# Patient Record
Sex: Female | Born: 2005
Health system: Southern US, Community
[De-identification: ages and names within clinical notes are randomized; demographics above are authoritative.]

## PROBLEM LIST (undated history)

## (undated) ENCOUNTER — Emergency Department (HOSPITAL_COMMUNITY): Admission: EM | Payer: BLUE CROSS/BLUE SHIELD | Source: Home / Self Care

## (undated) DIAGNOSIS — F419 Anxiety disorder, unspecified: Secondary | ICD-10-CM

## (undated) DIAGNOSIS — K589 Irritable bowel syndrome without diarrhea: Secondary | ICD-10-CM

---
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
## 2023-01-22 DIAGNOSIS — J019 Acute sinusitis, unspecified: Secondary | ICD-10-CM | POA: Diagnosis not present

## 2023-02-20 DIAGNOSIS — F419 Anxiety disorder, unspecified: Secondary | ICD-10-CM | POA: Diagnosis not present

## 2023-02-20 DIAGNOSIS — R5383 Other fatigue: Secondary | ICD-10-CM | POA: Diagnosis not present

## 2023-04-08 DIAGNOSIS — R52 Pain, unspecified: Secondary | ICD-10-CM | POA: Diagnosis not present

## 2023-04-08 DIAGNOSIS — J0100 Acute maxillary sinusitis, unspecified: Secondary | ICD-10-CM | POA: Diagnosis not present

## 2023-05-10 DIAGNOSIS — F419 Anxiety disorder, unspecified: Secondary | ICD-10-CM | POA: Diagnosis not present

## 2023-07-12 DIAGNOSIS — K219 Gastro-esophageal reflux disease without esophagitis: Secondary | ICD-10-CM | POA: Diagnosis not present

## 2023-07-12 DIAGNOSIS — D509 Iron deficiency anemia, unspecified: Secondary | ICD-10-CM | POA: Diagnosis not present

## 2023-07-12 DIAGNOSIS — R1084 Generalized abdominal pain: Secondary | ICD-10-CM | POA: Diagnosis not present

## 2023-07-12 DIAGNOSIS — G8929 Other chronic pain: Secondary | ICD-10-CM | POA: Diagnosis not present

## 2023-07-21 IMAGING — DX DG CHEST 2V
2 series · 2 of 2 positions shown · non-contrast
Comparison: None.

CLINICAL DATA: Chest pains.

EXAM:
CHEST - 2 VIEW

[chest pa]
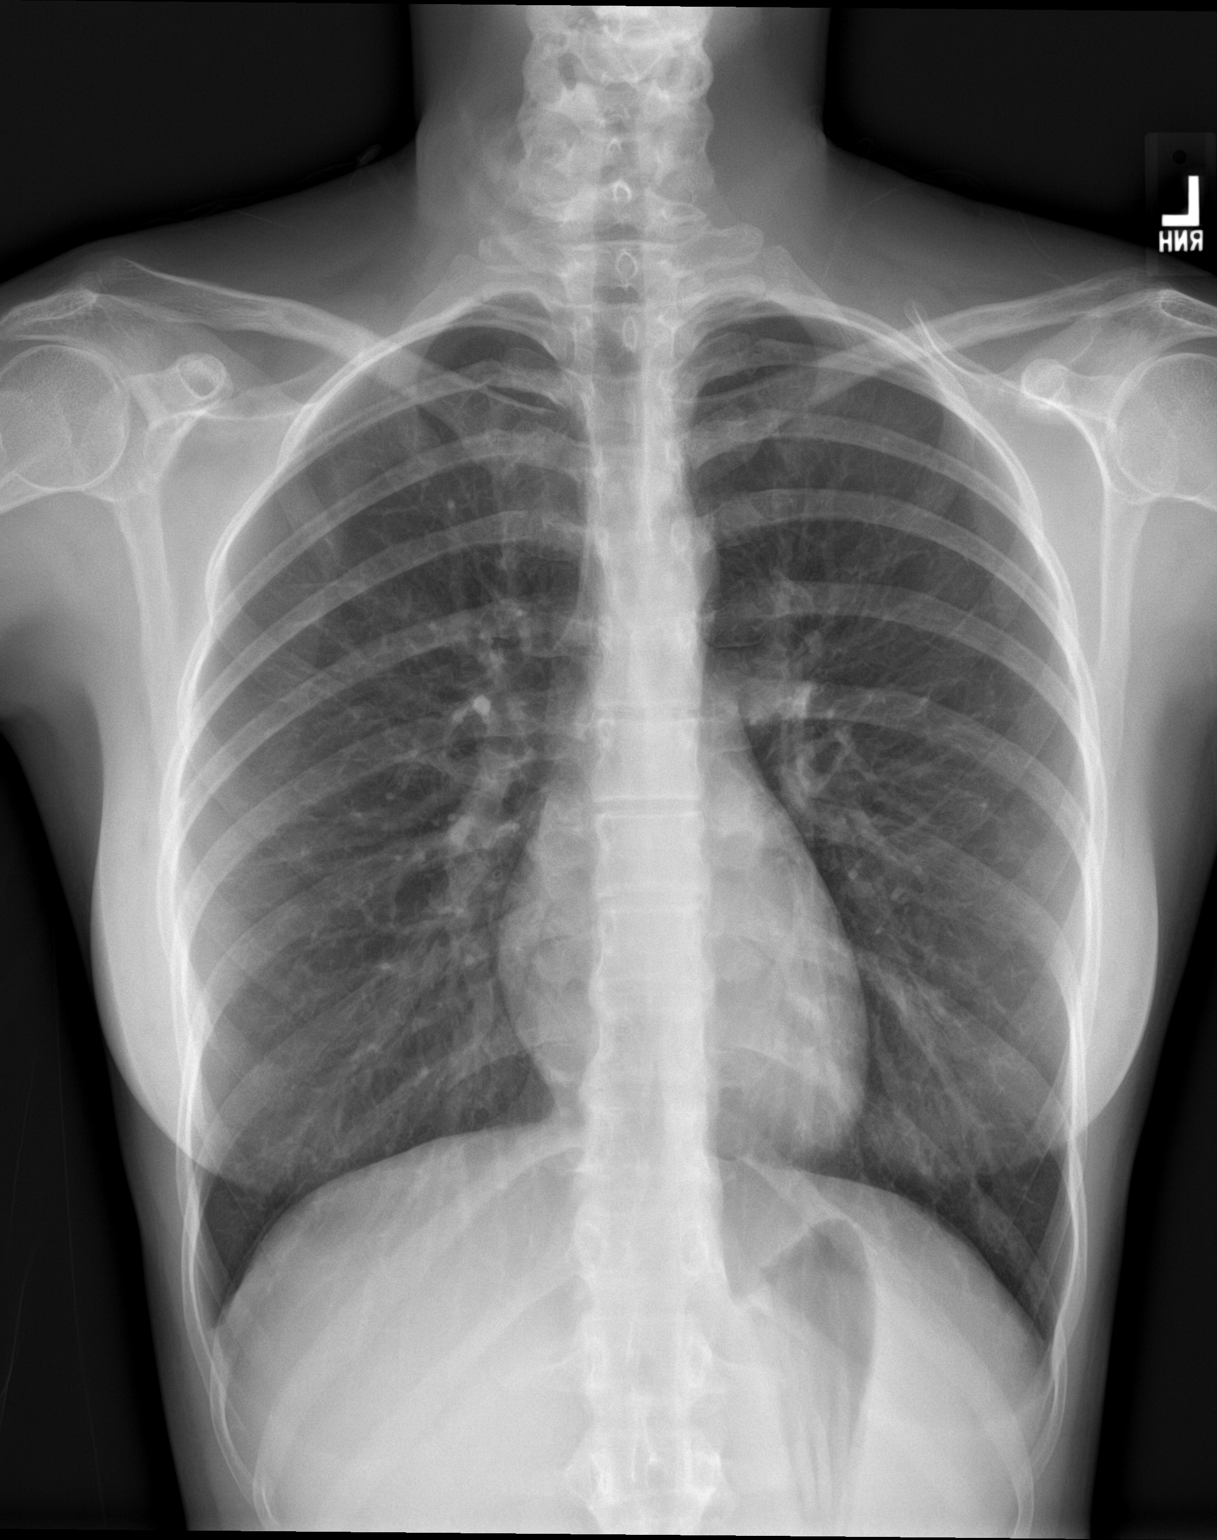

[chest lat]
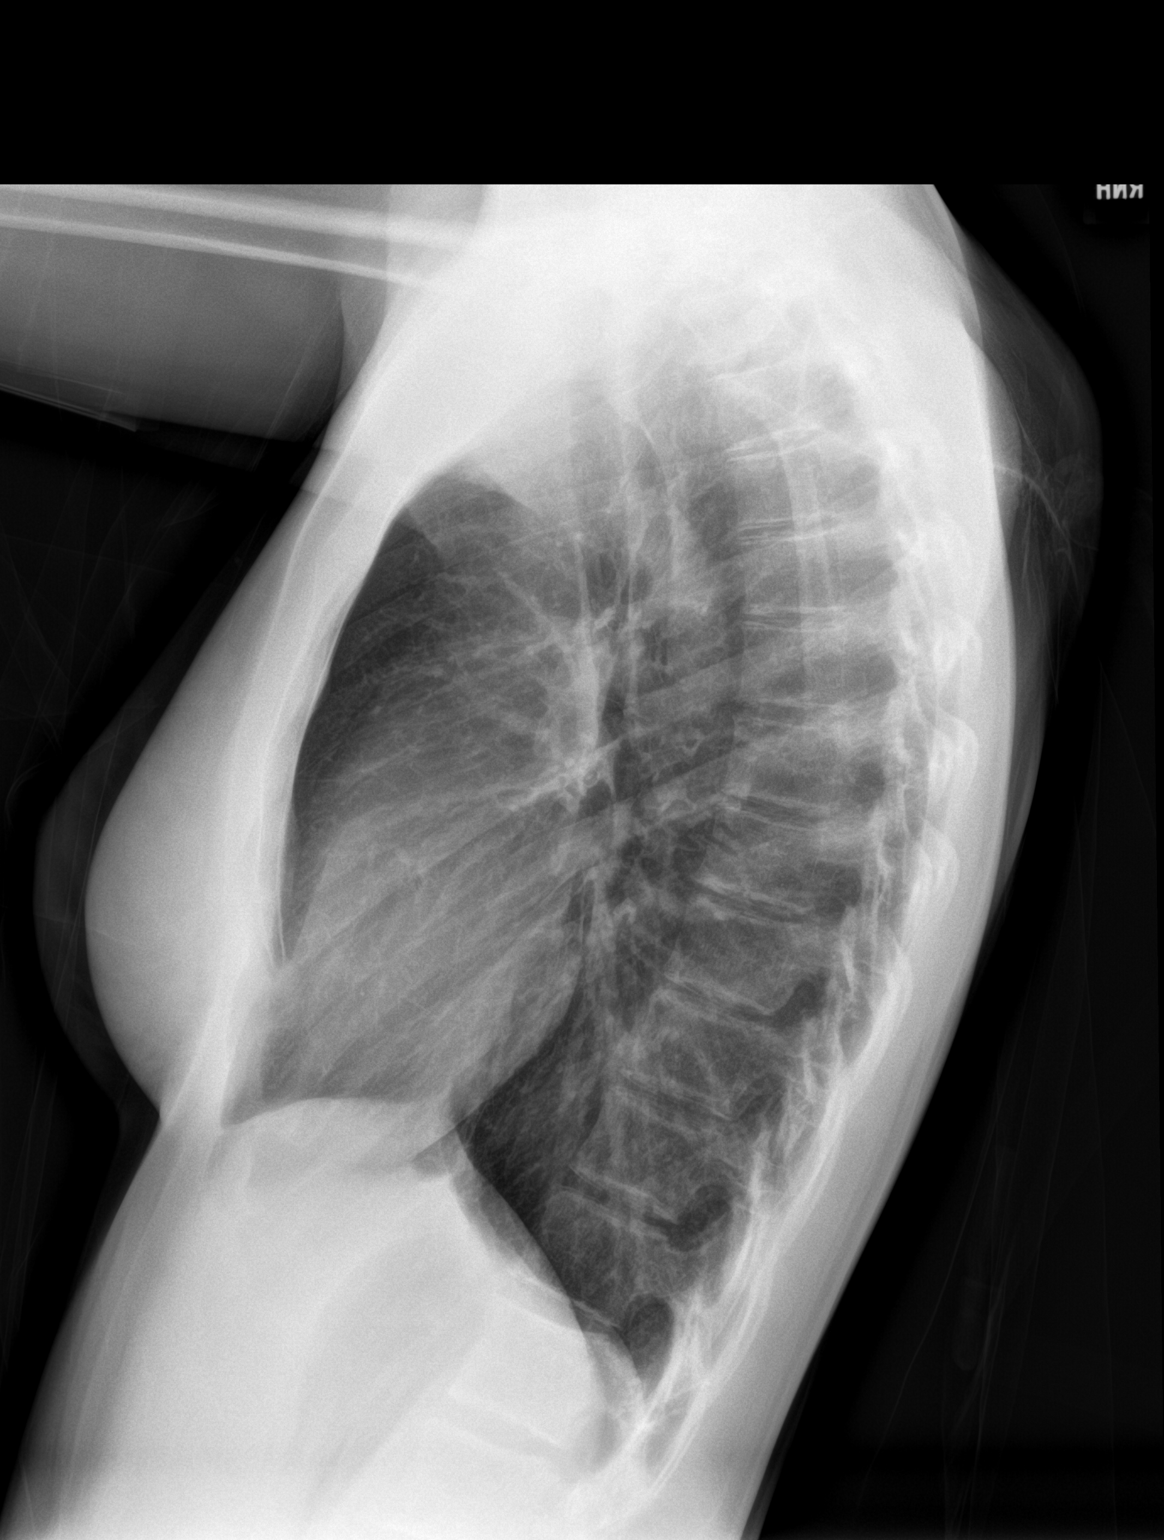

[2 of 2 positions shown; findings below may reference images not displayed]

FINDINGS: The heart size and mediastinal contours are within normal limits.
Both lungs are clear. The visualized skeletal structures are
unremarkable.
IMPRESSION: No active cardiopulmonary disease.

## 2023-08-15 DIAGNOSIS — R1010 Upper abdominal pain, unspecified: Secondary | ICD-10-CM | POA: Diagnosis not present

## 2023-09-04 DIAGNOSIS — K582 Mixed irritable bowel syndrome: Secondary | ICD-10-CM | POA: Diagnosis not present

## 2023-10-16 DIAGNOSIS — K638219 Small intestinal bacterial overgrowth, unspecified: Secondary | ICD-10-CM | POA: Diagnosis not present

## 2023-10-16 DIAGNOSIS — K582 Mixed irritable bowel syndrome: Secondary | ICD-10-CM | POA: Diagnosis not present

## 2023-10-16 DIAGNOSIS — R110 Nausea: Secondary | ICD-10-CM | POA: Diagnosis not present

## 2023-11-19 ENCOUNTER — Other Ambulatory Visit: Payer: Self-pay

## 2023-11-19 ENCOUNTER — Encounter (HOSPITAL_BASED_OUTPATIENT_CLINIC_OR_DEPARTMENT_OTHER): Payer: Self-pay | Admitting: Emergency Medicine

## 2023-11-19 ENCOUNTER — Emergency Department (HOSPITAL_BASED_OUTPATIENT_CLINIC_OR_DEPARTMENT_OTHER)

## 2023-11-19 ENCOUNTER — Emergency Department (HOSPITAL_BASED_OUTPATIENT_CLINIC_OR_DEPARTMENT_OTHER)
Admission: EM | Admit: 2023-11-19 | Discharge: 2023-11-19 | Disposition: A | Discharge status: Home / Self Care | Attending: Emergency Medicine | Admitting: Emergency Medicine

## 2023-11-19 DIAGNOSIS — R9431 Abnormal electrocardiogram [ECG] [EKG]: Secondary | ICD-10-CM | POA: Diagnosis not present

## 2023-11-19 DIAGNOSIS — R1084 Generalized abdominal pain: Secondary | ICD-10-CM | POA: Diagnosis not present

## 2023-11-19 DIAGNOSIS — R197 Diarrhea, unspecified: Secondary | ICD-10-CM | POA: Insufficient documentation

## 2023-11-19 DIAGNOSIS — R110 Nausea: Secondary | ICD-10-CM | POA: Diagnosis not present

## 2023-11-19 DIAGNOSIS — R109 Unspecified abdominal pain: Secondary | ICD-10-CM | POA: Diagnosis not present

## 2023-11-19 DIAGNOSIS — R188 Other ascites: Secondary | ICD-10-CM | POA: Diagnosis not present

## 2023-11-19 LAB — CBC WITH DIFFERENTIAL/PLATELET
Abs Immature Granulocytes: 0.03 K/uL (ref 0.00–0.07)
Basophils Absolute: 0 K/uL (ref 0.0–0.1)
Basophils Relative: 0 %
Eosinophils Absolute: 0.1 K/uL (ref 0.0–0.5)
Eosinophils Relative: 1 %
HCT: 38.2 % (ref 36.0–46.0)
Hemoglobin: 13 g/dL (ref 12.0–15.0)
Immature Granulocytes: 0 %
Lymphocytes Relative: 42 %
Lymphs Abs: 3.5 K/uL (ref 0.7–4.0)
MCH: 28.9 pg (ref 26.0–34.0)
MCHC: 34 g/dL (ref 30.0–36.0)
MCV: 84.9 fL (ref 80.0–100.0)
Monocytes Absolute: 0.6 K/uL (ref 0.1–1.0)
Monocytes Relative: 7 %
Neutro Abs: 4.2 K/uL (ref 1.7–7.7)
Neutrophils Relative %: 50 %
Platelets: 421 K/uL — ABNORMAL HIGH (ref 150–400)
RBC: 4.5 MIL/uL (ref 3.87–5.11)
RDW: 12.4 % (ref 11.5–15.5)
WBC: 8.4 K/uL (ref 4.0–10.5)
nRBC: 0 % (ref 0.0–0.2)

## 2023-11-19 LAB — URINALYSIS, ROUTINE W REFLEX MICROSCOPIC
Bilirubin Urine: NEGATIVE
Glucose, UA: NEGATIVE mg/dL
Hgb urine dipstick: NEGATIVE
Ketones, ur: NEGATIVE mg/dL
Leukocytes,Ua: NEGATIVE
Nitrite: NEGATIVE
Protein, ur: NEGATIVE mg/dL
Specific Gravity, Urine: 1.025 (ref 1.005–1.030)
pH: 5.5 (ref 5.0–8.0)

## 2023-11-19 LAB — COMPREHENSIVE METABOLIC PANEL WITH GFR
ALT: 34 U/L (ref 0–44)
AST: 25 U/L (ref 15–41)
Albumin: 4.7 g/dL (ref 3.5–5.0)
Alkaline Phosphatase: 65 U/L (ref 38–126)
Anion gap: 15 (ref 5–15)
BUN: 9 mg/dL (ref 6–20)
CO2: 21 mmol/L — ABNORMAL LOW (ref 22–32)
Calcium: 9.2 mg/dL (ref 8.9–10.3)
Chloride: 105 mmol/L (ref 98–111)
Creatinine, Ser: 0.75 mg/dL (ref 0.44–1.00)
GFR, Estimated: >60 mL/min (ref >60)
Glucose, Bld: 108 mg/dL — ABNORMAL HIGH (ref 70–99)
Potassium: 3.7 mmol/L (ref 3.5–5.1)
Sodium: 141 mmol/L (ref 135–145)
Total Bilirubin: 0.4 mg/dL (ref 0.0–1.2)
Total Protein: 7.5 g/dL (ref 6.5–8.1)

## 2023-11-19 LAB — PREGNANCY, URINE: Preg Test, Ur: NEGATIVE

## 2023-11-19 LAB — LIPASE, BLOOD: Lipase: 32 U/L (ref 11–51)

## 2023-11-19 MED ORDER — SODIUM CHLORIDE 0.9 % IV BOLUS
1000.0000 mL | Freq: Once | INTRAVENOUS | Status: AC
  Administered 2023-11-19: 1000 mL via INTRAVENOUS

## 2023-11-19 MED ORDER — METOCLOPRAMIDE HCL 5 MG/ML IJ SOLN
10.0000 mg | Freq: Once | INTRAMUSCULAR | Status: AC
  Administered 2023-11-19: 10 mg via INTRAVENOUS
  Filled 2023-11-19: qty 2

## 2023-11-19 MED ORDER — IOHEXOL 300 MG/ML  SOLN
60.0000 mL | Freq: Once | INTRAMUSCULAR | Status: AC | PRN
  Administered 2023-11-19: 60 mL via INTRAVENOUS

## 2023-11-19 MED ORDER — DROPERIDOL 2.5 MG/ML IJ SOLN
1.2500 mg | Freq: Once | INTRAMUSCULAR | Status: AC
  Administered 2023-11-19: 1.25 mg via INTRAVENOUS
  Filled 2023-11-19: qty 2

## 2023-11-19 MED ORDER — MORPHINE SULFATE (PF) 2 MG/ML IV SOLN
2.0000 mg | Freq: Once | INTRAVENOUS | Status: AC
  Administered 2023-11-19: 2 mg via INTRAVENOUS
  Filled 2023-11-19: qty 1

## 2023-11-19 MED ORDER — IOHEXOL 300 MG/ML  SOLN
30.0000 mL | Freq: Once | INTRAMUSCULAR | Status: AC | PRN
  Administered 2023-11-19: 30 mL via ORAL

## 2023-11-19 MED ORDER — ALUM & MAG HYDROXIDE-SIMETH 200-200-20 MG/5ML PO SUSP
30.0000 mL | Freq: Once | ORAL | Status: AC
  Administered 2023-11-19: 30 mL via ORAL
  Filled 2023-11-19: qty 30

## 2023-11-19 NOTE — Discharge Instructions (Addendum)
 Your laboratories also are within normal limits today.  The CT of your abdomen did not show any acute findings.  You were given a few of the report on today's visit.

## 2023-11-19 NOTE — ED Provider Notes (Signed)
 [place] [facility] Provider Note   CSN: [id number] Arrival date & time: 11/19/23  1351     Patient presents with: Abdominal Pain   Christine Paul is a 18 y.o. female.   18 year old female with a past medical history of IBS presents to the ED with a chief complaint of generalized abdominal pain of sudden onset this morning around 11 AM.  Patient is accompanied by mother and grandmother, endorsing generalized cramping throughout her entire abdomen, previously followed by [facility] gastroenterology.  Started on a new medication called hyoscyamine for abdominal attacks, reports trying to take this medication this morning, there was no improvement in her symptoms.  She continues to dry heave, she has not eaten then today according to mother.  She endorses pain starting at the epigastric region radiating down to her lower abdomen.  There is no focal point of tenderness.  She denies any vomiting, fever, prior surgery of her abdomen.  She was diagnosed with IBS and follow-up by at [facility].  The history is provided by the patient and a parent.  Abdominal Pain Pain location:  Generalized Pain radiates to:  Does not radiate Pain severity:  Severe Onset quality:  Sudden Duration:  4 hours Timing:  Constant Progression:  Worsening Associated symptoms: diarrhea and nausea   Associated symptoms: no chest pain, no chills, no fever, no shortness of breath, no sore throat and no vomiting        Prior to Admission medications   Medication Sig Start Date End Date Taking? Authorizing Provider  acetaminophen  (TYLENOL ) 160 MG/5ML solution Take 325 mg by mouth every 4 (four) hours as needed. For fever and pain    [provider]  FLUoxetine (PROZAC) 40 MG capsule Take 40 mg by mouth daily. 01/03/22   [provider]  Levonorgestrel-Ethinyl Estradiol (AMETHIA) 0.1-0.02 & 0.01 MG tablet Take 1 tablet by mouth daily. 02/07/22   [provider]  Multiple Vitamins-Minerals (ONE DAILY CALCIUM/IRON) TABS Take 1 tablet by mouth daily.    [provider]  ondansetron  (ZOFRAN -ODT) 4 MG disintegrating tablet Take 1 tablet (4 mg total) by mouth every 8 (eight) hours as needed. 03/09/21   Midge Golas, MD  pantoprazole  (PROTONIX ) 40 MG tablet Take 40 mg by mouth every morning.    [provider]  Pediatric Multivit-Minerals-C (KIDS GUMMY BEAR VITAMINS PO) Take 1 tablet by mouth daily.    [provider]  potassium chloride  SA (KLOR-CON  M) 20 MEQ tablet Take 2 tablets (40 mEq total) by mouth daily. 02/19/21   Tonia Chew, MD    Allergies: Amoxicillin-pot clavulanate    Review of Systems  Constitutional:  Negative for chills and fever.  HENT:  Negative for sore throat.   Respiratory:  Negative for shortness of breath.   Cardiovascular:  Negative for chest pain.  Gastrointestinal:  Positive for abdominal pain, diarrhea and nausea. Negative for vomiting.  Genitourinary:  Negative for difficulty urinating and flank pain.  Musculoskeletal:  Negative for back pain.  All other systems reviewed and are negative.   Updated Vital Signs BP 105/63   Pulse 89   Temp 98.9 F (37.2 C) (Oral)   Resp 14   Ht 5' 4 (1.626 m)   Wt 46.3 kg   SpO2 100%   BMI 17.51 kg/m   Physical Exam Vitals and nursing note reviewed.  Constitutional:      General: She is not in acute distress.    Appearance: She  is well-developed.     Comments: Teary eyed on exam.  HENT:     Head: Normocephalic and atraumatic.     Mouth/Throat:     Pharynx: No oropharyngeal exudate.  Eyes:     Pupils: Pupils are equal, round, and reactive to light.  Cardiovascular:     Rate and Rhythm: Regular rhythm.     Heart sounds: Normal heart sounds.  Pulmonary:     Effort: Pulmonary effort is normal. No respiratory distress.     Breath sounds: Normal breath sounds.  Abdominal:     General: Bowel sounds are normal. There is no  distension.     Palpations: Abdomen is soft.     Tenderness: There is generalized abdominal tenderness. There is guarding and rebound. There is no right CVA tenderness or left CVA tenderness.  Musculoskeletal:        General: No tenderness or deformity.     Cervical back: Normal range of motion.     Right lower leg: No edema.     Left lower leg: No edema.  Skin:    General: Skin is warm and dry.  Neurological:     Mental Status: She is alert and oriented to person, place, and time.     (all labs ordered are listed, but only abnormal results are displayed) Labs Reviewed  COMPREHENSIVE METABOLIC PANEL WITH GFR - Abnormal; Notable for the following components:      Result Value   CO2 21 (*)    Glucose, Bld 108 (*)    All other components within normal limits  CBC WITH DIFFERENTIAL/PLATELET - Abnormal; Notable for the following components:   Platelets 421 (*)    All other components within normal limits  LIPASE, BLOOD  URINALYSIS, ROUTINE W REFLEX MICROSCOPIC  PREGNANCY, URINE    EKG: EKG Interpretation Date/Time:  Sunday November 19 2023 18:04:50 EDT Ventricular Rate:  88 PR Interval:  148 QRS Duration:  104 QT Interval:  372 QTC Calculation: 451 R Axis:   78  Text Interpretation: Sinus rhythm Abnormal Q suggests anterior infarct Confirmed by Mannie Pac [phone]) on 11/19/2023 6:06:20 PM  Radiology: CT ABDOMEN PELVIS W CONTRAST Result Date: 11/19/2023 CLINICAL DATA:  Acute abdominal pain with nausea and diarrhea. EXAM: CT ABDOMEN AND PELVIS WITH CONTRAST TECHNIQUE: Multidetector CT imaging of the abdomen and pelvis was performed using the standard protocol following bolus administration of intravenous contrast. RADIATION DOSE REDUCTION: This exam was performed according to the departmental dose-optimization program which includes automated exposure control, adjustment of the mA and/or kV according to patient size and/or use of iterative reconstruction technique. CONTRAST:   30mL OMNIPAQUE  IOHEXOL  300 MG/ML SOLN, 60mL OMNIPAQUE  IOHEXOL  300 MG/ML SOLN COMPARISON:  None Available. FINDINGS: Lower chest: Unremarkable Hepatobiliary: Unremarkable Pancreas: Unremarkable Spleen: Unremarkable Adrenals/Urinary Tract: Unremarkable Stomach/Bowel: Trace stranding in the adipose tissues anterior to the proximal appendix and adjacent to the cecum. However, appendiceal diameter is currently within normal limits. No dilated bowel. Mild wall thickening a in a loop of pelvic small bowel on image 58 series 7. No specific abnormality of the terminal ileum is identified. Vascular/Lymphatic: Unremarkable Reproductive: Unremarkable Other: Small amount of pelvic ascites in the cul-de-sac region eccentric to the right. Musculoskeletal: Mild lower lumbar degenerative disc disease without impingement. IMPRESSION: 1. Trace stranding in the adipose tissues anterior to the proximal appendix and adjacent to the cecum. However, appendiceal diameter is currently within normal limits. 2. Mild wall thickening in a loop of pelvic small bowel. Differential diagnostic considerations include enteritis  or inflammatory bowel disease. However, the terminal ileum appears unremarkable. 3. Small amount of pelvic ascites in the cul-de-sac region eccentric to the right. 4. Mild lower lumbar degenerative disc disease without impingement. Electronically Signed   By: Ryan Salvage M.D.   On: 11/19/2023 17:49     Procedures   Medications Ordered in the ED  sodium chloride  0.9 % bolus 1,000 mL (0 mLs Intravenous Stopped 11/19/23 1654)  metoCLOPramide  (REGLAN ) injection 10 mg (10 mg Intravenous Given 11/19/23 1532)  morphine  (PF) 2 MG/ML injection 2 mg (2 mg Intravenous Given 11/19/23 1531)  alum & mag hydroxide-simeth (MAALOX/MYLANTA) 200-200-20 MG/5ML suspension 30 mL (30 mLs Oral Given 11/19/23 1538)  iohexol  (OMNIPAQUE ) 300 MG/ML solution 30 mL (30 mLs Oral Contrast Given 11/19/23 1721)  iohexol  (OMNIPAQUE ) 300 MG/ML  solution 60 mL (60 mLs Intravenous Contrast Given 11/19/23 1722)  droperidol  (INAPSINE ) 2.5 MG/ML injection 1.25 mg (1.25 mg Intravenous Given 11/19/23 1810)                                    Medical Decision Making Amount and/or Complexity of Data Reviewed Labs: ordered. Radiology: ordered.  Risk OTC drugs. Prescription drug management.     This patient presents to the ED for concern of abdominal pain, this involves a number of treatment options, and is a complaint that carries with it a high risk of complications and morbidity.  The differential diagnosis includes obstruction, appendicitis, IBS flareup.   Co morbidities: Discussed in HPI   Brief History:  See HPI  EMR reviewed including pt PMHx, past surgical history and past visits to ER.   See HPI for more details   Lab Tests:  I ordered and independently interpreted labs.  The pertinent results include:    I personally reviewed all laboratory work and imaging. Metabolic panel without any acute abnormality specifically kidney function within normal limits and no significant electrolyte abnormalities. CBC without leukocytosis or significant anemia.  Lipase level is normal.   Imaging Studies:  CT Abdomen and pelvis: IMPRESSION:  1. Trace stranding in the adipose tissues anterior to the proximal  appendix and adjacent to the cecum. However, appendiceal diameter is  currently within normal limits.  2. Mild wall thickening in a loop of pelvic small bowel.  Differential diagnostic considerations include enteritis or  inflammatory bowel disease. However, the terminal ileum appears  unremarkable.  3. Small amount of pelvic ascites in the cul-de-sac region eccentric  to the right.  4. Mild lower lumbar degenerative disc disease without impingement.      Electronically Signed    Medicines ordered:  I ordered medication including morphine , Reglan , bolus for symptomatic treatment Reevaluation of the patient  after these medicines showed that the patient improved I have reviewed the patients home medicines and have made adjustments as needed  Reevaluation:  After the interventions noted above I re-evaluated patient and found that they have :improved  Social Determinants of Health:  The patient's social determinants of health were a factor in the care of this patient   Problem List / ED Course:  Patient presenting to the ED with a chief complaint of abdominal pain, underlying history of IBS currently followed by [facility].  According to her mother she had a setting of tach approximately 4 hours prior to arrival in the ED.  On arrival she is teary-eyed, she is holding at her abdomen, stating there is severe pain.  She tried taking the medication that she was previously prescribed such as hycosimide.  Has not had any oral intake today, was dry heaving. Laboratory results obtained while in triage.  UA without any nitrites, leukocytes or signs to suggest infection.  Pregnancy test is negative.  CBC with no leukocytosis, hemoglobin is within normal limits.  CMP with no electrolyte derangement, creatinine levels within normal limits.  LFTs are unremarkable.  Total bili is also normal.  Lipase levels normal.  Given bolus, Reglan , morphine  to help with pain control. Recheck abdominal exam with improvement in symptoms, does have scheduled pain.  CT abdomen and pelvis obtained, this shows some concern for IBD, she is already diagnosed with IBS, I discussed with the mother that we are not able to find the root of the cause of the problem however she does have good gastroenterology follow-up, will need to continue following up with them and make an appointment this upcoming week. No concern for appendicitis, diverticulitis, ovarian torsion, ectopic pregnancy.  Given droperidol  after EKG. CT without any concern for appendicitis, concern for IBD she is currently followed by GI, this report was provided to her  mother, we did discuss appropriate follow-up with them.  Patient is feeling better, tolerating p.o. adequately.  Close follow-up recommended.  Dispostion:  After consideration of the diagnostic results and the patients response to treatment, I feel that the patent would benefit from follow-up with [facility].   Portions of this note were generated with Scientist, clinical (histocompatibility and immunogenetics). Dictation errors may occur despite best attempts at proofreading.   Final diagnoses:  Generalized abdominal pain    ED Discharge Orders     None          Maureen Broad, PA-C 11/19/23 1846    Mannie Pac T, DO 11/19/23 2317

## 2023-11-19 NOTE — ED Triage Notes (Signed)
 Pt reports generalized abd pain (indicates entire abd area) that started 1 hr ago; +nausea and diarrhea; pt tearful in triage; sts she has had this happen before, but has not had a definitive dx

## 2023-11-19 NOTE — ED Notes (Signed)
 Patient vomited post-ingestion of oral contrast. CT made aware patient unable to tolerate oral contrast. Patient instructed to stop drinking per ct recommendation.

## 2023-11-21 DIAGNOSIS — K529 Noninfective gastroenteritis and colitis, unspecified: Secondary | ICD-10-CM | POA: Diagnosis not present

## 2023-11-21 DIAGNOSIS — R110 Nausea: Secondary | ICD-10-CM | POA: Diagnosis not present

## 2023-11-30 DIAGNOSIS — Z7689 Persons encountering health services in other specified circumstances: Secondary | ICD-10-CM | POA: Diagnosis not present

## 2023-12-12 DIAGNOSIS — F419 Anxiety disorder, unspecified: Secondary | ICD-10-CM | POA: Diagnosis not present

## 2023-12-12 DIAGNOSIS — M79642 Pain in left hand: Secondary | ICD-10-CM | POA: Diagnosis not present

## 2023-12-12 DIAGNOSIS — R1084 Generalized abdominal pain: Secondary | ICD-10-CM | POA: Diagnosis not present

## 2023-12-12 DIAGNOSIS — Z3040 Encounter for surveillance of contraceptives, unspecified: Secondary | ICD-10-CM | POA: Diagnosis not present

## 2023-12-12 DIAGNOSIS — G8929 Other chronic pain: Secondary | ICD-10-CM | POA: Diagnosis not present

## 2023-12-13 DIAGNOSIS — R110 Nausea: Secondary | ICD-10-CM | POA: Diagnosis not present

## 2023-12-14 DIAGNOSIS — K529 Noninfective gastroenteritis and colitis, unspecified: Secondary | ICD-10-CM | POA: Diagnosis not present

## 2024-02-08 DIAGNOSIS — K8010 Calculus of gallbladder with chronic cholecystitis without obstruction: Secondary | ICD-10-CM | POA: Diagnosis not present

## 2024-02-08 DIAGNOSIS — K828 Other specified diseases of gallbladder: Secondary | ICD-10-CM | POA: Diagnosis not present
